# Patient Record
Sex: Female | Born: 1995 | Race: Black or African American | Hispanic: No | Marital: Single | Smoking: Current some day smoker
Health system: Southern US, Community
[De-identification: ages and names within clinical notes are randomized; demographics above are authoritative.]

## PROBLEM LIST (undated history)

## (undated) DIAGNOSIS — I456 Pre-excitation syndrome: Secondary | ICD-10-CM

---

## 2017-01-27 ENCOUNTER — Other Ambulatory Visit: Payer: Self-pay

## 2017-01-27 DIAGNOSIS — R197 Diarrhea, unspecified: Secondary | ICD-10-CM | POA: Insufficient documentation

## 2017-01-27 DIAGNOSIS — Z5321 Procedure and treatment not carried out due to patient leaving prior to being seen by health care provider: Secondary | ICD-10-CM | POA: Insufficient documentation

## 2017-01-27 DIAGNOSIS — R112 Nausea with vomiting, unspecified: Secondary | ICD-10-CM | POA: Insufficient documentation

## 2017-01-27 LAB — CBC
HEMATOCRIT: 38.3 % (ref 35.0–47.0)
HEMOGLOBIN: 12 g/dL (ref 12.0–16.0)
MCH: 21.3 pg — ABNORMAL LOW (ref 26.0–34.0)
MCHC: 31.5 g/dL — AB (ref 32.0–36.0)
MCV: 67.7 fL — ABNORMAL LOW (ref 80.0–100.0)
Platelets: 174 10*3/uL (ref 150–440)
RBC: 5.66 MIL/uL — AB (ref 3.80–5.20)
RDW: 14.8 % — ABNORMAL HIGH (ref 11.5–14.5)
WBC: 4 10*3/uL (ref 3.6–11.0)

## 2017-01-27 LAB — POCT PREGNANCY, URINE: PREG TEST UR: NEGATIVE

## 2017-01-27 NOTE — ED Triage Notes (Signed)
Pt arrives to ED via POV with c/o generalized abdominal pain that started 2 days ago. Pt reports (+) N/V/D. Pt denies known sick contacts. Pt reports (2) episodes of emesis and (3) episodes of diarrhea in the last 24 hrs. Pt is A&O, in NAD; RR even, regular, and unlabored; skin color/temp is WNL.

## 2017-01-28 ENCOUNTER — Emergency Department
Admission: EM | Admit: 2017-01-28 | Discharge: 2017-01-28 | Disposition: A | Discharge status: Home / Self Care | Payer: Self-pay | Attending: Emergency Medicine | Admitting: Emergency Medicine

## 2017-01-28 LAB — URINALYSIS, COMPLETE (UACMP) WITH MICROSCOPIC
BILIRUBIN URINE: NEGATIVE
Bacteria, UA: NONE SEEN
Glucose, UA: NEGATIVE mg/dL
Hgb urine dipstick: NEGATIVE
KETONES UR: NEGATIVE mg/dL
LEUKOCYTES UA: NEGATIVE
NITRITE: NEGATIVE
PH: 8 (ref 5.0–8.0)
Protein, ur: NEGATIVE mg/dL
SQUAMOUS EPITHELIAL / LPF: NONE SEEN
Specific Gravity, Urine: 1.016 (ref 1.005–1.030)

## 2017-01-28 LAB — COMPREHENSIVE METABOLIC PANEL
ALK PHOS: 65 U/L (ref 38–126)
ALT: 10 U/L — ABNORMAL LOW (ref 14–54)
ANION GAP: 10 (ref 5–15)
AST: 23 U/L (ref 15–41)
Albumin: 4.1 g/dL (ref 3.5–5.0)
BILIRUBIN TOTAL: 0.3 mg/dL (ref 0.3–1.2)
BUN: 9 mg/dL (ref 6–20)
CALCIUM: 9.2 mg/dL (ref 8.9–10.3)
CO2: 21 mmol/L — ABNORMAL LOW (ref 22–32)
Chloride: 108 mmol/L (ref 101–111)
Creatinine, Ser: 0.63 mg/dL (ref 0.44–1.00)
GFR calc non Af Amer: >60 mL/min (ref >60)
GLUCOSE: 102 mg/dL — AB (ref 65–99)
Potassium: 3.7 mmol/L (ref 3.5–5.1)
Sodium: 139 mmol/L (ref 135–145)
TOTAL PROTEIN: 7.6 g/dL (ref 6.5–8.1)

## 2017-01-28 LAB — LIPASE, BLOOD: Lipase: 27 U/L (ref 11–51)

## 2017-01-29 ENCOUNTER — Telehealth: Payer: Self-pay | Admitting: Emergency Medicine

## 2017-01-29 NOTE — Telephone Encounter (Signed)
Called patient due to lwot to inquire about condition and follow up plans.  No answer and no voicemail  

## 2017-11-24 ENCOUNTER — Emergency Department
Admission: EM | Admit: 2017-11-24 | Discharge: 2017-11-25 | Disposition: A | Discharge status: Home / Self Care | Payer: Medicaid Other | Attending: Emergency Medicine | Admitting: Emergency Medicine

## 2017-11-24 ENCOUNTER — Other Ambulatory Visit: Payer: Self-pay

## 2017-11-24 ENCOUNTER — Emergency Department: Payer: Medicaid Other

## 2017-11-24 DIAGNOSIS — N12 Tubulo-interstitial nephritis, not specified as acute or chronic: Secondary | ICD-10-CM

## 2017-11-24 DIAGNOSIS — R1011 Right upper quadrant pain: Secondary | ICD-10-CM | POA: Insufficient documentation

## 2017-11-24 LAB — URINALYSIS, COMPLETE (UACMP) WITH MICROSCOPIC
Bacteria, UA: NONE SEEN
Bilirubin Urine: NEGATIVE
Glucose, UA: NEGATIVE mg/dL
Ketones, ur: 20 mg/dL — AB
Nitrite: NEGATIVE
Protein, ur: 100 mg/dL — AB
Specific Gravity, Urine: 1.013 (ref 1.005–1.030)
WBC, UA: >50 WBC/hpf — ABNORMAL HIGH (ref 0–5)
pH: 9 — ABNORMAL HIGH (ref 5.0–8.0)

## 2017-11-24 LAB — POCT PREGNANCY, URINE: Preg Test, Ur: NEGATIVE

## 2017-11-24 MED ORDER — IOPAMIDOL (ISOVUE-300) INJECTION 61%
15.0000 mL | INTRAVENOUS | Status: AC
  Administered 2017-11-24 (×2): 15 mL via ORAL
  Filled 2017-11-24 (×2): qty 15

## 2017-11-24 MED ORDER — MORPHINE SULFATE (PF) 4 MG/ML IV SOLN
4.0000 mg | Freq: Once | INTRAVENOUS | Status: AC
  Administered 2017-11-24: 4 mg via INTRAVENOUS
  Filled 2017-11-24: qty 1

## 2017-11-24 MED ORDER — ONDANSETRON HCL 4 MG/2ML IJ SOLN
4.0000 mg | Freq: Once | INTRAMUSCULAR | Status: AC
  Administered 2017-11-24: 4 mg via INTRAVENOUS
  Filled 2017-11-24: qty 2

## 2017-11-24 NOTE — ED Provider Notes (Signed)
[facility] Emergency Department Provider Note  ____________________________________________  Time seen: Approximately 10:51 PM  I have reviewed the triage vital signs and the nursing notes.   HISTORY  Chief Complaint Rib Injury    HPI Evelyn Rogers is a 22 y.o. female presents to the emergency department with 8 out of 10 sharp abdominal pain primarily in the right upper quadrant that has occurred for the past 3 days.  She reports that when symptoms started, she could not discern whether her pain was coming from her right sided upper back or underneath her ribs.  Patient has had fever, shortness of breath and vomiting. Abdominal pain is worse after eating or drinking.  She has never had similar symptoms in the past.  No prior GI issues or surgeries.  She denies dysuria, hematuria or increased urinary frequency.  No prior history of pyelonephritis or nephrolithiasis.  There have been no changes in vaginal discharge.  Patient is sexually active and reports that she always uses protection. Denies possibility of pregnancy. Patient has not had excessive anti-inflammatories or alcohol.  No hematochezia or hemoptysis.  No current cough, rhinorrhea or congestion.   History reviewed. No pertinent past medical history.  There are no active problems to display for this patient.   History reviewed. No pertinent surgical history.  Prior to Admission medications   Not on File    Allergies Penicillins  No family history on file.  Social History Social History   Tobacco Use  . Smoking status: Not on file  Substance Use Topics  . Alcohol use: Not on file  . Drug use: Not on file     Review of Systems  Constitutional: Patient has fever.  Eyes: No visual changes. No discharge ENT: No upper respiratory complaints. Cardiovascular: no chest pain. Respiratory: no cough. No SOB. Gastrointestinal: Patient has abdominal pain and nausea. No diarrhea.  No  constipation. Genitourinary: Negative for dysuria. No hematuria Musculoskeletal: Negative for musculoskeletal pain. Skin: Negative for rash, abrasions, lacerations, ecchymosis. Neurological: Negative for headaches, focal weakness or numbness.   ____________________________________________   PHYSICAL EXAM:  VITAL SIGNS: ED Triage Vitals  Enc Vitals Group     BP 11/24/17 2154 118/70     Pulse Rate 11/24/17 2154 (!) 102     Resp 11/24/17 2154 (!) 45     Temp 11/24/17 2154 99.9 F (37.7 C)     Temp Source 11/24/17 2154 Oral     SpO2 11/24/17 2154 100 %     Weight 11/24/17 2155 112 lb (50.8 kg)     Height 11/24/17 2155 5' (1.524 m)     Head Circumference --      Peak Flow --      Pain Score 11/24/17 2220 8     Pain Loc --      Pain Edu? --      Excl. in GC? --      Constitutional: Alert and oriented.  Patient appears in pain. Eyes: Conjunctivae are normal. PERRL. EOMI. Head: Atraumatic. ENT:      Nose: No congestion/rhinnorhea.      Mouth/Throat: Mucous membranes are moist.  Neck: No stridor.  No cervical spine tenderness to palpation. Hematological/Lymphatic/Immunilogical: No cervical lymphadenopathy. Cardiovascular: Normal rate, regular rhythm. Normal S1 and S2.  Good peripheral circulation. Respiratory: Normal respiratory effort without tachypnea or retractions. Lungs CTAB. Good air entry to the bases with no decreased or absent breath sounds. Gastrointestinal: To inspection, no striae or prior surgical incisions.  Patient has  guarding and rigidity of the right upper quadrant.  Positive Murphy sign.  Patient has tenderness to palpation in right and left lower quadrants.  Bowel sounds are auscultated in all 4 quadrants.  CVA tenderness is elicited on the right. Musculoskeletal: Full range of motion to all extremities. No gross deformities appreciated. Neurologic:  Normal speech and language. No gross focal neurologic deficits are appreciated.  Skin:  Skin is warm, dry and  intact. No rash noted. Psychiatric: Mood and affect are normal. Speech and behavior are normal. Patient exhibits appropriate insight and judgement.   ____________________________________________   LABS (all labs ordered are listed, but only abnormal results are displayed)  Labs Reviewed  CBC WITH DIFFERENTIAL/PLATELET  COMPREHENSIVE METABOLIC PANEL  URINALYSIS, COMPLETE (UACMP) WITH MICROSCOPIC  LACTIC ACID, PLASMA  LACTIC ACID, PLASMA  LIPASE, BLOOD  HCG, QUANTITATIVE, PREGNANCY  POC URINE PREG, ED  POCT PREGNANCY, URINE   ____________________________________________  EKG   ____________________________________________  RADIOLOGY I personally viewed and evaluated these images as part of my medical decision making, as well as reviewing the written report by the radiologist.    Dg Chest 2 View  Result Date: 11/24/2017 CLINICAL DATA:  Right upper quadrant pain EXAM: CHEST - 2 VIEW COMPARISON:  None. FINDINGS: The heart size and mediastinal contours are within normal limits. Both lungs are clear. The visualized skeletal structures are unremarkable. IMPRESSION: No active cardiopulmonary disease. Electronically Signed   By: Jasmine Pang M.D.   On: 11/24/2017 23:14    ____________________________________________    PROCEDURES  Procedure(s) performed:    Procedures    Medications  iopamidol (ISOVUE-300) 61 % injection 15 mL (15 mLs Oral Contrast Given 11/24/17 2300)  morphine 4 MG/ML injection 4 mg (4 mg Intravenous Given 11/24/17 2327)  ondansetron (ZOFRAN) injection 4 mg (4 mg Intravenous Given 11/24/17 2327)     ____________________________________________   INITIAL IMPRESSION / ASSESSMENT AND PLAN / ED COURSE  Pertinent labs & imaging results that were available during my care of the patient were reviewed by me and considered in my medical decision making (see chart for details).  Review of the [place] CSRS was performed in accordance of the [facility] prior to  dispensing any controlled drugs.    Assessment and Plan:  Abdominal pain Differential diagnosis includes cholecystitis, nephrolithiasis, pyelonephritis, appendicitis, diverticulitis and pulmonary embolism. Patient presents to the emergency department with worsening right upper quadrant abdominal pain for the past three days.  On physical exam, patient also had guarding and rigidity in the right upper quadrant and tenderness in the right and left lower quadrants before morphine was administered.  Due to diffuse nature of pain, CT abdomen and pelvis was ordered.  Patient was transitioned to main side of the emergency department while awaiting labs.  Dr. Marisa Severin assumed patient care.   ____________________________________________  FINAL CLINICAL IMPRESSION(S) / ED DIAGNOSES  Final diagnoses:  Right upper quadrant abdominal pain      NEW MEDICATIONS STARTED DURING THIS VISIT:  ED Discharge Orders    None          This chart was dictated using voice recognition software/Dragon. Despite best efforts to proofread, errors can occur which can change the meaning. Any change was purely unintentional.    Orvil Feil, PA-C 11/24/17 2356    Dionne Bucy, MD 12/04/17 [phone]

## 2017-11-24 NOTE — ED Triage Notes (Signed)
Pt comes via POV from home with c/o right sided rib pain. Pt states this started about 3 days ago and it has gotten worse. Pt denies any recent injury. Pt states some nausea.

## 2017-11-24 NOTE — ED Notes (Signed)
Pt went to CT

## 2017-11-25 ENCOUNTER — Emergency Department: Payer: Medicaid Other

## 2017-11-25 ENCOUNTER — Encounter: Payer: Self-pay | Admitting: Radiology

## 2017-11-25 LAB — COMPREHENSIVE METABOLIC PANEL
ALT: 10 U/L (ref 0–44)
AST: 25 U/L (ref 15–41)
Albumin: 4.8 g/dL (ref 3.5–5.0)
Alkaline Phosphatase: 71 U/L (ref 38–126)
Anion gap: 12 (ref 5–15)
BUN: 9 mg/dL (ref 6–20)
CO2: 20 mmol/L — ABNORMAL LOW (ref 22–32)
Calcium: 9.3 mg/dL (ref 8.9–10.3)
Chloride: 105 mmol/L (ref 98–111)
Creatinine, Ser: 0.85 mg/dL (ref 0.44–1.00)
GFR calc Af Amer: >60 mL/min (ref >60)
GFR calc non Af Amer: >60 mL/min (ref >60)
Glucose, Bld: 82 mg/dL (ref 70–99)
Potassium: 3.3 mmol/L — ABNORMAL LOW (ref 3.5–5.1)
Sodium: 137 mmol/L (ref 135–145)
Total Bilirubin: 0.6 mg/dL (ref 0.3–1.2)
Total Protein: 8.9 g/dL — ABNORMAL HIGH (ref 6.5–8.1)

## 2017-11-25 LAB — CBC WITH DIFFERENTIAL/PLATELET
Basophils Absolute: 0.1 10*3/uL (ref 0–0.1)
Basophils Relative: 1 %
Eosinophils Absolute: 0 10*3/uL (ref 0–0.7)
Eosinophils Relative: 0 %
HCT: 35.6 % (ref 35.0–47.0)
Hemoglobin: 11.6 g/dL — ABNORMAL LOW (ref 12.0–16.0)
Lymphocytes Relative: 34 %
Lymphs Abs: 2.4 10*3/uL (ref 1.0–3.6)
MCH: 21.9 pg — ABNORMAL LOW (ref 26.0–34.0)
MCHC: 32.7 g/dL (ref 32.0–36.0)
MCV: 66.8 fL — ABNORMAL LOW (ref 80.0–100.0)
Monocytes Absolute: 0.8 10*3/uL (ref 0.2–0.9)
Monocytes Relative: 11 %
Neutro Abs: 3.8 10*3/uL (ref 1.4–6.5)
Neutrophils Relative %: 54 %
Platelets: 169 10*3/uL (ref 150–400)
RBC: 5.33 MIL/uL — ABNORMAL HIGH (ref 3.80–5.20)
RDW: 15.1 % — ABNORMAL HIGH (ref 11.5–14.5)
WBC: 7 10*3/uL (ref 3.6–11.0)

## 2017-11-25 LAB — LIPASE, BLOOD: Lipase: 27 U/L (ref 11–51)

## 2017-11-25 LAB — TROPONIN I

## 2017-11-25 LAB — LACTIC ACID, PLASMA
LACTIC ACID, VENOUS: 0.8 mmol/L (ref 0.5–1.9)
Lactic Acid, Venous: 2.3 mmol/L (ref 0.5–1.9)

## 2017-11-25 LAB — HCG, QUANTITATIVE, PREGNANCY: hCG, Beta Chain, Quant, S: <1 m[IU]/mL (ref <5)

## 2017-11-25 MED ORDER — IBUPROFEN 600 MG PO TABS: 600.0000 mg | ORAL_TABLET | Freq: Four times a day (QID) | ORAL | 0 refills | Status: AC | PRN

## 2017-11-25 MED ORDER — SODIUM CHLORIDE 0.9 % IV BOLUS
1000.0000 mL | Freq: Once | INTRAVENOUS | Status: AC
  Administered 2017-11-25: 1000 mL via INTRAVENOUS

## 2017-11-25 MED ORDER — SODIUM CHLORIDE 0.9 % IV SOLN
1.0000 g | Freq: Once | INTRAVENOUS | Status: AC
  Administered 2017-11-25: 1 g via INTRAVENOUS
  Filled 2017-11-25: qty 10

## 2017-11-25 MED ORDER — ONDANSETRON 8 MG PO TBDP: 8.0000 mg | ORAL_TABLET | Freq: Three times a day (TID) | ORAL | 0 refills | Status: AC | PRN

## 2017-11-25 MED ORDER — IOHEXOL 300 MG/ML  SOLN
75.0000 mL | Freq: Once | INTRAMUSCULAR | Status: AC | PRN
  Administered 2017-11-25: 75 mL via INTRAVENOUS

## 2017-11-25 MED ORDER — CEPHALEXIN 500 MG PO CAPS: 500.0000 mg | ORAL_CAPSULE | Freq: Two times a day (BID) | ORAL | 0 refills | Status: AC

## 2017-11-25 NOTE — ED Notes (Signed)
Pt returns to room from CT 

## 2017-11-25 NOTE — Discharge Instructions (Signed)
Take the antibiotic as prescribed and finish the full course.  You should take the ibuprofen as needed for pain and the Zofran (ondansetron) as needed for nausea.  Return to the ER for new, worsening, or persistent severe pain, high fevers, vomiting or inability to take the medication, weakness, or any other new or worsening symptoms that concern you.

## 2017-11-25 NOTE — ED Provider Notes (Signed)
-----------------------------------------   12:03 AM on 11/25/2017 -----------------------------------------  I am sharing this visit with PA Joseph Art, and have taken over for the PA at this time.  I examined the patient independently.  She reports right-sided abdominal pain radiating to the back into the chest since this morning and over the last few days.  On exam, she has a borderline fever and tachycardia, but otherwise normal vital signs.  She is relatively well-appearing.  She is tender throughout the right side of the abdomen, and especially in the right lower quadrant.  Labs are pending at this time.  ED ECG REPORT I, Dionne Bucy, the attending physician, personally viewed and interpreted this ECG.  Date: 11/25/2017 EKG Time: 2338 Rate: 85 Rhythm: normal sinus rhythm QRS Axis: normal Intervals: normal ST/T Wave abnormalities: T wave inversions in V2 to V4 Narrative Interpretation: Nonspecific T wave abnormalities anterolateral   EKG shows nonspecific T wave abnormality with T wave inversions in V2 and V3.  The only prior EKGs available are in care everywhere from other hospital sites and I am not able to view images however the EKGs are described as having a nonspecific T wave abnormalities I believe this is likely chronic, and I do not suspect a cardiac etiology in this young otherwise healthy patient.  Overall the description of the patient's symptoms is most consistent with gallbladder disease, however her exam is also concerning for appendicitis or colitis.  We will therefore proceed with a CT of the abdomen and lab work-up and reassess.  ----------------------------------------- 4:23 AM on 11/25/2017 -----------------------------------------  The CT revealed cystitis but no evidence of appendicitis or gallbladder disease.  The patient's lab work-up was reassuring except for slightly elevated lactate and her UA is consistent with infection.  Given the location of the  patient's pain I think that her presentation is more consistent with early pyelonephritis.  After fluids her lactate normalized, and her vital signs have remained normal for several hours.  There is no evidence of sepsis.  The patient has not required any additional pain medication since the initial dose.  She feels well and would like to go home.  I think she is safe for discharge at this time.  I discussed with her that pyelonephritis sometimes requires admission for IV antibiotics and gave thorough return precautions.  The patient expressed understanding.   Dionne Bucy, MD 11/25/17 0425

## 2017-11-25 NOTE — ED Notes (Signed)
Pt to CT via stretcher accomp by CT tech 

## 2017-12-28 ENCOUNTER — Other Ambulatory Visit: Payer: Self-pay

## 2017-12-28 ENCOUNTER — Emergency Department
Admission: EM | Admit: 2017-12-28 | Discharge: 2017-12-28 | Disposition: A | Discharge status: Home / Self Care | Payer: Medicaid Other | Attending: Emergency Medicine | Admitting: Emergency Medicine

## 2017-12-28 DIAGNOSIS — F17200 Nicotine dependence, unspecified, uncomplicated: Secondary | ICD-10-CM | POA: Insufficient documentation

## 2017-12-28 DIAGNOSIS — B9789 Other viral agents as the cause of diseases classified elsewhere: Secondary | ICD-10-CM

## 2017-12-28 DIAGNOSIS — R05 Cough: Secondary | ICD-10-CM | POA: Insufficient documentation

## 2017-12-28 DIAGNOSIS — J069 Acute upper respiratory infection, unspecified: Secondary | ICD-10-CM | POA: Insufficient documentation

## 2017-12-28 HISTORY — DX: Pre-excitation syndrome: I45.6

## 2017-12-28 MED ORDER — PREDNISONE 20 MG PO TABS
60.0000 mg | ORAL_TABLET | Freq: Once | ORAL | Status: AC
  Administered 2017-12-28: 60 mg via ORAL
  Filled 2017-12-28: qty 3

## 2017-12-28 MED ORDER — CETIRIZINE-PSEUDOEPHEDRINE ER 5-120 MG PO TB12: 1.0000 | ORAL_TABLET | Freq: Two times a day (BID) | ORAL | 0 refills | Status: AC

## 2017-12-28 MED ORDER — DEXAMETHASONE SODIUM PHOSPHATE 10 MG/ML IJ SOLN: 10.0000 mg | Freq: Once | INTRAMUSCULAR | Status: DC

## 2017-12-28 MED ORDER — BENZONATATE 100 MG PO CAPS: ORAL_CAPSULE | ORAL | 0 refills | Status: DC

## 2017-12-28 MED ORDER — ONDANSETRON 4 MG PO TBDP: 4.0000 mg | ORAL_TABLET | Freq: Three times a day (TID) | ORAL | 0 refills | Status: AC | PRN

## 2017-12-28 MED ORDER — ONDANSETRON 4 MG PO TBDP
4.0000 mg | ORAL_TABLET | Freq: Once | ORAL | Status: AC
  Administered 2017-12-28: 4 mg via ORAL
  Filled 2017-12-28: qty 1

## 2017-12-28 NOTE — ED Provider Notes (Signed)
[facility] Emergency Department Provider Note ____________________________________________  Time seen: 2130  I have reviewed the triage vital signs and the nursing notes.  HISTORY  Chief Complaint  Influenza  HPI Evelyn Rogers is a 22 y.o. female who presents herself to the ED for evaluation of a 4-day complaint of chills, sweats, vomiting, and flulike symptoms.  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  She denies any significant productive cough, chest pain, congestion, or shortness of breath.  She is unclear whether she has had frank fevers, but has noted some sweats.  She denies any recent travel or other exposures at this time.  She has been taking TheraFlu and ibuprofen with limited benefit.  Past Medical History:  Diagnosis Date  . Wolff-Parkinson-White syndrome     There are no active problems to display for this patient.   History reviewed. No pertinent surgical history.  Prior to Admission medications   Medication Sig Start Date End Date Taking? Authorizing Provider  benzonatate (TESSALON PERLES) 100 MG capsule Take 1-2 tabs TID prn cough 12/28/17   Dalaina Tates, Charlesetta Ivory, PA-C  cetirizine-pseudoephedrine (ZYRTEC-D) 5-120 MG tablet Take 1 tablet by mouth 2 (two) times daily for 10 days. 12/28/17 01/07/18  Gulianna Hornsby, Charlesetta Ivory, PA-C  ibuprofen (ADVIL,MOTRIN) 600 MG tablet Take 1 tablet (600 mg total) by mouth every 6 (six) hours as needed (pain). 11/25/17   Dionne Bucy, MD  ondansetron (ZOFRAN ODT) 4 MG disintegrating tablet Take 1 tablet (4 mg total) by mouth every 8 (eight) hours as needed for up to 3 days. 12/28/17 12/31/17  Tommie Dejoseph, Charlesetta Ivory, PA-C    Allergies Chocolate and Penicillins  History reviewed. No pertinent family history.  Social History Social History   Tobacco Use  . Smoking status: Current Every Day Smoker  Substance Use Topics  . Alcohol use: Yes  . Drug use: Not on file     Review of Systems  Constitutional: Negative for fever.  Reports sweats and hot/cold flashes.. Eyes: Negative for visual changes. ENT: Negative for sore throat. Cardiovascular: Negative for chest pain. Respiratory: Negative for shortness of breath. Gastrointestinal: Negative for abdominal pain, and diarrhea.  Reports intermittent episodes of nausea and vomiting. Genitourinary: Negative for dysuria. Musculoskeletal: Negative for back pain.  Reports some mild body aches. Skin: Negative for rash. Neurological: Negative for headaches, focal weakness or numbness. ____________________________________________  PHYSICAL EXAM:  VITAL SIGNS: ED Triage Vitals  Enc Vitals Group     BP 12/28/17 1858 128/78     Pulse Rate 12/28/17 1858 89     Resp 12/28/17 1858 18     Temp 12/28/17 1858 98.6 F (37 C)     Temp Source 12/28/17 1858 Oral     SpO2 12/28/17 1858 100 %     Weight 12/28/17 1859 112 lb (50.8 kg)     Height 12/28/17 1859 5' (1.524 m)     Head Circumference --      Peak Flow --      Pain Score 12/28/17 1859 7     Pain Loc --      Pain Edu? --      Excl. in GC? --     Constitutional: Alert and oriented. Well appearing and in no distress. Head: Normocephalic and atraumatic. Eyes: Conjunctivae are normal. PERRL. Normal extraocular movements Ears: Canals clear. TMs intact bilaterally. Nose: No congestion/rhinorrhea/epistaxis. Mouth/Throat: Mucous membranes are moist.  Uvula is midline and tonsils are flat.  No oropharyngeal  lesions are appreciated. Neck: Supple. No thyromegaly. Hematological/Lymphatic/Immunological: No cervical lymphadenopathy. Cardiovascular: Normal rate, regular rhythm. Normal distal pulses. Respiratory: Normal respiratory effort. No wheezes/rales/rhonchi. Gastrointestinal: Soft and nontender. No distention.  Normal bowel sounds noted. ____________________________________________  PROCEDURES  Procedures Zofran 4 mg ODT Prednisone 60 mg  PO ____________________________________________  INITIAL IMPRESSION / ASSESSMENT AND PLAN / ED COURSE  Patient with ED evaluation of flulike symptoms.  Patient symptoms likely represent a viral etiology including a possible viral gastroenteritis.  She has not had any nausea or vomiting in the last 18 to 24 hours.  Patient is also been afebrile during her course and on presentation in the ED.  Symptoms likely represent a viral URI.  She will be treated with prescriptions for Tessalon Perles, Zyrtec D, and Zofran for nausea.  She is encouraged to take an over-the-counter Mucinex as needed for thinning the mucus is necessary.  A work note is provided for today and tomorrow as requested.  She should follow-up with your clinic for ongoing symptom management. ____________________________________________  FINAL CLINICAL IMPRESSION(S) / ED DIAGNOSES  Final diagnoses:  Viral URI with cough      Eriyah Fernando, Charlesetta Ivory, PA-C 12/28/17 2312    Rockne Menghini, MD 12/31/17 1422

## 2017-12-28 NOTE — Discharge Instructions (Addendum)
You are being treated for a viral URI. Take the prescription meds as directed. Follow-up with [facility] for ongoing symptoms.

## 2017-12-28 NOTE — ED Notes (Signed)
Cough  Body  Aches  Sneezing coughing  Vomiting /  Nose bleeds  symptoms x  3  Days no  Fever  Speaking in complete  sentences and is in no acute  Distress

## 2017-12-28 NOTE — ED Triage Notes (Addendum)
Pt c/o of flu like symptoms. Hot and cold flashes, nose bleeds, sweats, vomiting, unable to sleep. Symptoms since Thursday. 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   Denies cough, congestion, unsure of fever.   A&O, ambulatory. No distress noted, mask in place.

## 2017-12-30 ENCOUNTER — Other Ambulatory Visit: Payer: Self-pay

## 2017-12-30 ENCOUNTER — Encounter: Payer: Self-pay | Admitting: Emergency Medicine

## 2017-12-30 DIAGNOSIS — J101 Influenza due to other identified influenza virus with other respiratory manifestations: Secondary | ICD-10-CM | POA: Insufficient documentation

## 2017-12-30 DIAGNOSIS — R079 Chest pain, unspecified: Secondary | ICD-10-CM | POA: Insufficient documentation

## 2017-12-30 DIAGNOSIS — F17210 Nicotine dependence, cigarettes, uncomplicated: Secondary | ICD-10-CM | POA: Insufficient documentation

## 2017-12-30 LAB — CBC
HEMATOCRIT: 35.6 % — AB (ref 36.0–46.0)
HEMOGLOBIN: 11.4 g/dL — AB (ref 12.0–15.0)
MCH: 21.3 pg — AB (ref 26.0–34.0)
MCHC: 32 g/dL (ref 30.0–36.0)
MCV: 66.5 fL — ABNORMAL LOW (ref 80.0–100.0)
NRBC: 0 % (ref 0.0–0.2)
Platelets: 89 10*3/uL — ABNORMAL LOW (ref 150–400)
RBC: 5.35 MIL/uL — AB (ref 3.87–5.11)
RDW: 15.9 % — ABNORMAL HIGH (ref 11.5–15.5)
WBC: 3 10*3/uL — ABNORMAL LOW (ref 4.0–10.5)

## 2017-12-30 LAB — COMPREHENSIVE METABOLIC PANEL
ALBUMIN: 4.2 g/dL (ref 3.5–5.0)
ALK PHOS: 52 U/L (ref 38–126)
ALT: 21 U/L (ref 0–44)
AST: 35 U/L (ref 15–41)
Anion gap: 10 (ref 5–15)
BUN: 12 mg/dL (ref 6–20)
CHLORIDE: 107 mmol/L (ref 98–111)
CO2: 23 mmol/L (ref 22–32)
Calcium: 8.7 mg/dL — ABNORMAL LOW (ref 8.9–10.3)
Creatinine, Ser: 0.59 mg/dL (ref 0.44–1.00)
GFR calc non Af Amer: >60 mL/min (ref >60)
Glucose, Bld: 127 mg/dL — ABNORMAL HIGH (ref 70–99)
POTASSIUM: 3.4 mmol/L — AB (ref 3.5–5.1)
Sodium: 140 mmol/L (ref 135–145)
Total Bilirubin: 0.7 mg/dL (ref 0.3–1.2)
Total Protein: 8 g/dL (ref 6.5–8.1)

## 2017-12-30 LAB — URINALYSIS, COMPLETE (UACMP) WITH MICROSCOPIC
Bacteria, UA: NONE SEEN
Bilirubin Urine: NEGATIVE
Glucose, UA: NEGATIVE mg/dL
Hgb urine dipstick: NEGATIVE
Ketones, ur: 20 mg/dL — AB
Nitrite: NEGATIVE
Protein, ur: 30 mg/dL — AB
Specific Gravity, Urine: 1.024 (ref 1.005–1.030)
pH: 6 (ref 5.0–8.0)

## 2017-12-30 LAB — POCT PREGNANCY, URINE: Preg Test, Ur: NEGATIVE

## 2017-12-30 LAB — LIPASE, BLOOD: Lipase: 49 U/L (ref 11–51)

## 2017-12-30 NOTE — ED Triage Notes (Signed)
Patient ambulatory to triage with steady gait, without difficulty or distress noted; pt reports here Sunday and dx with virus; cont to have N/V/D, back pain, and diff sleeping

## 2017-12-31 ENCOUNTER — Emergency Department: Payer: Self-pay

## 2017-12-31 ENCOUNTER — Emergency Department
Admission: EM | Admit: 2017-12-31 | Discharge: 2017-12-31 | Disposition: A | Discharge status: Home / Self Care | Payer: Self-pay | Attending: Emergency Medicine | Admitting: Emergency Medicine

## 2017-12-31 ENCOUNTER — Other Ambulatory Visit: Payer: Self-pay

## 2017-12-31 DIAGNOSIS — J101 Influenza due to other identified influenza virus with other respiratory manifestations: Secondary | ICD-10-CM

## 2017-12-31 LAB — INFLUENZA PANEL BY PCR (TYPE A & B)
INFLAPCR: NEGATIVE
Influenza B By PCR: POSITIVE — AB

## 2017-12-31 MED ORDER — IBUPROFEN 600 MG PO TABS
600.0000 mg | ORAL_TABLET | Freq: Once | ORAL | Status: AC
  Administered 2017-12-31: 600 mg via ORAL
  Filled 2017-12-31: qty 1

## 2017-12-31 MED ORDER — OXYMETAZOLINE HCL 0.05 % NA SOLN
1.0000 | Freq: Once | NASAL | Status: AC
  Administered 2017-12-31: 1 via NASAL
  Filled 2017-12-31: qty 15

## 2017-12-31 MED ORDER — LIDOCAINE HCL (PF) 1 % IJ SOLN
5.0000 mL | Freq: Once | INTRAMUSCULAR | Status: AC
  Administered 2017-12-31: 5 mL via INTRADERMAL
  Filled 2017-12-31: qty 5

## 2017-12-31 MED ORDER — ACETAMINOPHEN 500 MG PO TABS
1000.0000 mg | ORAL_TABLET | Freq: Once | ORAL | Status: AC
  Administered 2017-12-31: 1000 mg via ORAL
  Filled 2017-12-31: qty 2

## 2017-12-31 MED ORDER — ALBUTEROL SULFATE (2.5 MG/3ML) 0.083% IN NEBU
5.0000 mg | INHALATION_SOLUTION | Freq: Once | RESPIRATORY_TRACT | Status: AC
  Administered 2017-12-31: 5 mg via RESPIRATORY_TRACT
  Filled 2017-12-31: qty 6

## 2017-12-31 MED ORDER — NAPROXEN 500 MG PO TABS: 500.0000 mg | ORAL_TABLET | Freq: Two times a day (BID) | ORAL | 0 refills | Status: AC

## 2017-12-31 MED ORDER — ALBUTEROL SULFATE HFA 108 (90 BASE) MCG/ACT IN AERS: 2.0000 | INHALATION_SPRAY | Freq: Four times a day (QID) | RESPIRATORY_TRACT | 0 refills | Status: AC | PRN

## 2017-12-31 MED ORDER — BENZONATATE 200 MG PO CAPS: 200.0000 mg | ORAL_CAPSULE | Freq: Four times a day (QID) | ORAL | 0 refills | Status: AC | PRN

## 2017-12-31 MED ORDER — PSEUDOEPHEDRINE HCL 30 MG PO TABS
60.0000 mg | ORAL_TABLET | Freq: Once | ORAL | Status: AC
  Administered 2017-12-31: 60 mg via ORAL
  Filled 2017-12-31: qty 2

## 2017-12-31 NOTE — Discharge Instructions (Signed)
Today your positive for influenza B.  It is normal with influenza to be sick up to a full 10 to 14 days.  Please take naproxen twice a day as prescribed and use your albuterol and Tessalon Perles as needed for shortness of breath and cough.  Please take the next 2 days off of work and make sure you remain well-hydrated.  Return to the emergency department for any concerns.  It was a pleasure to take care of you today, and thank you for coming to our emergency department.  If you have any questions or concerns before leaving please ask the nurse to grab me and I'm more than happy to go through your aftercare instructions again.  If you were prescribed any opioid pain medication today such as Norco, Vicodin, Percocet, morphine, hydrocodone, or oxycodone please make sure you do not drive when you are taking this medication as it can alter your ability to drive safely.  If you have any concerns once you are home that you are not improving or are in fact getting worse before you can make it to your follow-up appointment, please do not hesitate to call 911 and come back for further evaluation.  Merrily Brittle, MD  Results for orders placed or performed during the hospital encounter of 12/31/17  Lipase, blood  Result Value Ref Range   Lipase 49 11 - 51 U/L  Comprehensive metabolic panel  Result Value Ref Range   Sodium 140 135 - 145 mmol/L   Potassium 3.4 (L) 3.5 - 5.1 mmol/L   Chloride 107 98 - 111 mmol/L   CO2 23 22 - 32 mmol/L   Glucose, Bld 127 (H) 70 - 99 mg/dL   BUN 12 6 - 20 mg/dL   Creatinine, Ser 2.13 0.44 - 1.00 mg/dL   Calcium 8.7 (L) 8.9 - 10.3 mg/dL   Total Protein 8.0 6.5 - 8.1 g/dL   Albumin 4.2 3.5 - 5.0 g/dL   AST 35 15 - 41 U/L   ALT 21 0 - 44 U/L   Alkaline Phosphatase 52 38 - 126 U/L   Total Bilirubin 0.7 0.3 - 1.2 mg/dL   GFR calc non Af Amer >60 >60 mL/min   GFR calc Af Amer >60 >60 mL/min   Anion gap 10 5 - 15  CBC  Result Value Ref Range   WBC 3.0 (L) 4.0 - 10.5  K/uL   RBC 5.35 (H) 3.87 - 5.11 MIL/uL   Hemoglobin 11.4 (L) 12.0 - 15.0 g/dL   HCT 08.6 (L) 57.8 - 46.9 %   MCV 66.5 (L) 80.0 - 100.0 fL   MCH 21.3 (L) 26.0 - 34.0 pg   MCHC 32.0 30.0 - 36.0 g/dL   RDW 62.9 (H) 52.8 - 41.3 %   Platelets 89 (L) 150 - 400 K/uL   nRBC 0.0 0.0 - 0.2 %  Urinalysis, Complete w Microscopic  Result Value Ref Range   Color, Urine YELLOW (A) YELLOW   APPearance CLEAR (A) CLEAR   Specific Gravity, Urine 1.024 1.005 - 1.030   pH 6.0 5.0 - 8.0   Glucose, UA NEGATIVE NEGATIVE mg/dL   Hgb urine dipstick NEGATIVE NEGATIVE   Bilirubin Urine NEGATIVE NEGATIVE   Ketones, ur 20 (A) NEGATIVE mg/dL   Protein, ur 30 (A) NEGATIVE mg/dL   Nitrite NEGATIVE NEGATIVE   Leukocytes, UA TRACE (A) NEGATIVE   RBC / HPF 0-5 0 - 5 RBC/hpf   WBC, UA 0-5 0 - 5 WBC/hpf   Bacteria, UA  NONE SEEN NONE SEEN   Squamous Epithelial / LPF 6-10 0 - 5   Mucus PRESENT   Influenza panel by PCR (type A & B)  Result Value Ref Range   Influenza A By PCR NEGATIVE NEGATIVE   Influenza B By PCR POSITIVE (A) NEGATIVE  Pregnancy, urine POC  Result Value Ref Range   Preg Test, Ur NEGATIVE NEGATIVE   Dg Chest 2 View  Result Date: 12/31/2017 CLINICAL DATA:  Shortness of breath EXAM: CHEST - 2 VIEW COMPARISON:  12/31/2017 FINDINGS: The heart size and mediastinal contours are within normal limits. Both lungs are clear. The visualized skeletal structures are unremarkable. IMPRESSION: Clear lungs. Electronically Signed   By: Deatra Robinson M.D.   On: 12/31/2017 02:24

## 2017-12-31 NOTE — ED Notes (Signed)
Pt signed hardcopy of discharge paperwork 

## 2017-12-31 NOTE — ED Provider Notes (Signed)
[facility] Emergency Department Provider Note  ____________________________________________   First MD Initiated Contact with Patient 12/31/17 0106     (approximate)  I have reviewed the triage vital signs and the nursing notes.   HISTORY  Chief Complaint Emesis and Diarrhea   HPI Evelyn Rogers is a 22 y.o. female self presents to the emergency department with roughly 6 days of nasal congestion, dry cough, sharp upper chest pain, nausea vomiting diarrhea, back pain, and difficulty sleeping.  She has not noted any particular fevers.  She was seen in our emergency department 2 days ago and was diagnosed with "virus" however her symptoms persist.  She came primarily because of the myalgias as well as the cough which is worse at night.  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  She did not get her flu shot this year.  Symptoms came on gradually have been slowly progressive are now mostly constant.  They are worse at night.  They are improved somewhat with ibuprofen.    Past Medical History:  Diagnosis Date  . Wolff-Parkinson-White syndrome     There are no active problems to display for this patient.   History reviewed. No pertinent surgical history.  Prior to Admission medications   Medication Sig Start Date End Date Taking? Authorizing Provider  albuterol (PROVENTIL HFA;VENTOLIN HFA) 108 (90 Base) MCG/ACT inhaler Inhale 2 puffs into the lungs every 6 (six) hours as needed for wheezing or shortness of breath. 12/31/17   Merrily Brittle, MD  benzonatate (TESSALON) 200 MG capsule Take 1 capsule (200 mg total) by mouth every 6 (six) hours as needed for cough. 12/31/17 12/31/18  Merrily Brittle, MD  cetirizine-pseudoephedrine (ZYRTEC-D) 5-120 MG tablet Take 1 tablet by mouth 2 (two) times daily for 10 days. 12/28/17 01/07/18  Menshew, Charlesetta Ivory, PA-C  ibuprofen (ADVIL,MOTRIN) 600 MG tablet Take 1  tablet (600 mg total) by mouth every 6 (six) hours as needed (pain). 11/25/17   Dionne Bucy, MD  naproxen (NAPROSYN) 500 MG tablet Take 1 tablet (500 mg total) by mouth 2 (two) times daily with a meal. 12/31/17 12/31/18  Merrily Brittle, MD  ondansetron (ZOFRAN ODT) 4 MG disintegrating tablet Take 1 tablet (4 mg total) by mouth every 8 (eight) hours as needed for up to 3 days. 12/28/17 12/31/17  Menshew, Charlesetta Ivory, PA-C    Allergies Chocolate and Penicillins  No family history on file.  Social History Social History   Tobacco Use  . Smoking status: Current Some Day Smoker  . Smokeless tobacco: Never Used  Substance Use Topics  . Alcohol use: Yes  . Drug use: Not on file    Review of Systems Constitutional: No fever/chills Eyes: No visual changes. ENT: Positive for sore throat. Cardiovascular: Positive for chest pain. Respiratory: Positive for shortness of breath. Gastrointestinal: No abdominal pain.  Positive for nausea, positive for vomiting.  Positive for diarrhea.  No constipation. Genitourinary: Negative for dysuria. Musculoskeletal: Positive for back pain. Skin: Negative for rash. Neurological: Negative for headaches, focal weakness or numbness.   ____________________________________________   PHYSICAL EXAM:  VITAL SIGNS: ED Triage Vitals  Enc Vitals Group     BP 12/30/17 2103 114/77     Pulse Rate 12/30/17 2103 93     Resp 12/30/17 2103 18     Temp 12/30/17 2103 98.6 F (37 C)     Temp Source 12/30/17 2103 Oral  SpO2 --      Weight 12/30/17 2104 109 lb (49.4 kg)     Height 12/30/17 2104 5' (1.524 m)     Head Circumference --      Peak Flow --      Pain Score 12/30/17 2108 7     Pain Loc --      Pain Edu? --      Excl. in GC? --     Constitutional: Alert and oriented x4 appears obviously somewhat uncomfortable in speaking with nasal voice and dry cough Eyes: PERRL EOMI. Head: Atraumatic. Nose: Positive for significant  congestion Mouth/Throat: No trismus uvula midline no pharyngeal erythema or exudate Neck: No stridor.   Cardiovascular: Normal rate, regular rhythm. Grossly normal heart sounds.  Good peripheral circulation. Respiratory: Slightly increased respiratory effort with diffuse expiratory wheeze in all fields.  Moving good air Gastrointestinal: Soft nontender Musculoskeletal: No lower extremity edema   Neurologic:  Normal speech and language. No gross focal neurologic deficits are appreciated. Skin:  Skin is warm, dry and intact. No rash noted. Psychiatric: Mood and affect are normal. Speech and behavior are normal.    ____________________________________________   DIFFERENTIAL includes but not limited to  Bronchitis, upper respiratory tract infection, sinusitis, pneumonia, influenza ____________________________________________   LABS (all labs ordered are listed, but only abnormal results are displayed)  Labs Reviewed  COMPREHENSIVE METABOLIC PANEL - Abnormal; Notable for the following components:      Result Value   Potassium 3.4 (*)    Glucose, Bld 127 (*)    Calcium 8.7 (*)    All other components within normal limits  CBC - Abnormal; Notable for the following components:   WBC 3.0 (*)    RBC 5.35 (*)    Hemoglobin 11.4 (*)    HCT 35.6 (*)    MCV 66.5 (*)    MCH 21.3 (*)    RDW 15.9 (*)    Platelets 89 (*)    All other components within normal limits  URINALYSIS, COMPLETE (UACMP) WITH MICROSCOPIC - Abnormal; Notable for the following components:   Color, Urine YELLOW (*)    APPearance CLEAR (*)    Ketones, ur 20 (*)    Protein, ur 30 (*)    Leukocytes, UA TRACE (*)    All other components within normal limits  INFLUENZA PANEL BY PCR (TYPE A & B) - Abnormal; Notable for the following components:   Influenza B By PCR POSITIVE (*)    All other components within normal limits  LIPASE, BLOOD  POCT PREGNANCY, URINE    Lab work reviewed by me shows the patient is influenza  B positive.  She does have ketones in her urine consistent with some degree of starvation __________________________________________  EKG   ____________________________________________  RADIOLOGY  Chest x-ray reviewed by me with no ____________________________________________   PROCEDURES  Procedure(s) performed: no  Procedures  Critical Care performed: no  ____________________________________________   INITIAL IMPRESSION / ASSESSMENT AND PLAN / ED COURSE  Pertinent labs & imaging results that were available during my care of the patient were reviewed by me and considered in my medical decision making (see chart for details).   As part of my medical decision making, I reviewed the following data within the electronic MEDICAL RECORD NUMBER History obtained from family if available, nursing notes, old chart and ekg, as well as notes from prior ED visits.  The patient comes to the emergency department with significant congestion dry cough and myalgias for the past  6 days or so.  She did not get a flu shot.  Lab work obtained prior to my evaluation is largely unremarkable aside from some ketosis.  Given her persistent symptoms and wheeze I think is reasonable to get a chest x-ray looking for pneumonia.  She does not describe ever getting better and then acutely worse.  Also check her for influenza.  We will give a breathing treatment with albuterol as well as lidocaine and the breathing treatment for her cough Tylenol ibuprofen Sudafed and Afrin for myalgias and congestion.  The patient did come back influenza B positive.  She is outside of any sort of window for Tamiflu.  Her wheezing is consistent with bronchitis and her chest x-ray has no consolidations.  I explained the patient predicted clinical course and that is not unreasonable to expect to feel sick for up to 10 days with influenza.  I will prescribe her naproxen as has been shown to inhibit viral replication and influenza along with  Jerilynn Som and give her the next several days off of work.  Return precautions have been given.      ____________________________________________   FINAL CLINICAL IMPRESSION(S) / ED DIAGNOSES  Final diagnoses:  Influenza B      NEW MEDICATIONS STARTED DURING THIS VISIT:  Discharge Medication List as of 12/31/2017  2:32 AM    START taking these medications   Details  albuterol (PROVENTIL HFA;VENTOLIN HFA) 108 (90 Base) MCG/ACT inhaler Inhale 2 puffs into the lungs every 6 (six) hours as needed for wheezing or shortness of breath., Starting Wed 12/31/2017, Print    naproxen (NAPROSYN) 500 MG tablet Take 1 tablet (500 mg total) by mouth 2 (two) times daily with a meal., Starting Wed 12/31/2017, Until Thu 12/31/2018, Print         Note:  This document was prepared using Dragon voice recognition software and may include unintentional dictation errors.     Merrily Brittle, MD 12/31/17 [phone]

## 2017-12-31 NOTE — ED Notes (Signed)
Pt states having a HA, middle back pain, and congestion along with nausea and diarrhea. Pt reports her symptoms started on last Thursday. Family is with the pt. Pt is alert and oriented x 4.

## 2018-01-18 ENCOUNTER — Emergency Department
Admission: EM | Admit: 2018-01-18 | Discharge: 2018-01-18 | Disposition: A | Discharge status: Home / Self Care | Payer: Medicaid Other | Attending: Emergency Medicine | Admitting: Emergency Medicine

## 2018-01-18 ENCOUNTER — Other Ambulatory Visit: Payer: Self-pay

## 2018-01-18 DIAGNOSIS — M549 Dorsalgia, unspecified: Secondary | ICD-10-CM | POA: Insufficient documentation

## 2018-01-18 DIAGNOSIS — Z5321 Procedure and treatment not carried out due to patient leaving prior to being seen by health care provider: Secondary | ICD-10-CM | POA: Insufficient documentation

## 2018-01-18 NOTE — ED Triage Notes (Signed)
Patient reports having pain from her neck down her spine and difficulty breathing that woke her from sleep.  Patient states unsure if it was anxiety or not.  Patient reports having multiple stressors - jobs and roommate.

## 2018-01-18 NOTE — ED Notes (Signed)
Patient to waiting room via wheelchair by EMS for anxiety and pain from neck down.  EMS reports all vitals within normal limits.  Per EMS patient was ambulatory without difficulty at scene.

## 2018-01-18 NOTE — ED Notes (Signed)
Pt to the door tearful asking for AMA papers. Pt states she does not want to wait anymore and she has things to do today and she is in pain. Told pt to stay if she was in pain so she could receive help. Pt states she just wants to go home. Pt refused to stay and allow treatment to continue. Pt states she has a job interview this morning. Pt signed AMA form.

## 2018-01-18 NOTE — ED Notes (Signed)
Pt noted to be walking in waiting room; asked pt if she had already been seen; she says she didn't but she has to leave;

## 2018-01-18 NOTE — ED Notes (Signed)
Pt awake and alert; waiting patiently for treatment room 

## 2019-09-09 IMAGING — CT CT ABD-PELV W/ CM
2 of 4 series · 16 of 46 positions shown, 18 images · IV contrast (APPLIED)
Comparison: None.

CLINICAL DATA: Right upper quadrant abdominal pain

EXAM:
CT ABDOMEN AND PELVIS WITH CONTRAST
TECHNIQUE: Multidetector CT imaging of the abdomen and pelvis was performed
using the standard protocol following bolus administration of
intravenous contrast.
CONTRAST:  75mL OMNIPAQUE IOHEXOL 300 MG/ML  SOLN

[Series 2: routine abd/pel with · axial · 0.53mm/px · z∈[-1177,-797]mm · 13 of 84 slices shown, 15 images]
[im 4/84  soft-tissue]
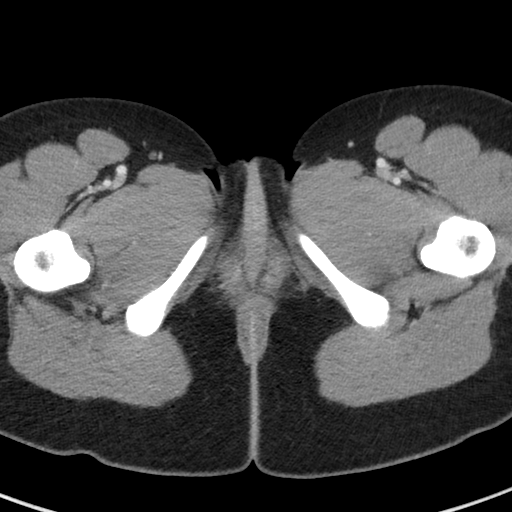
[im 4/84  bone]
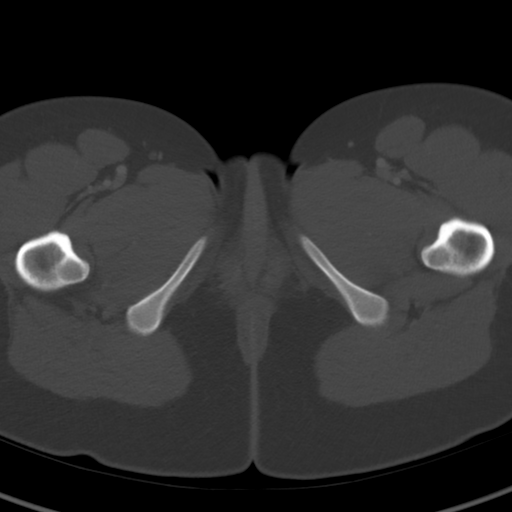
[im 11/84  soft-tissue]
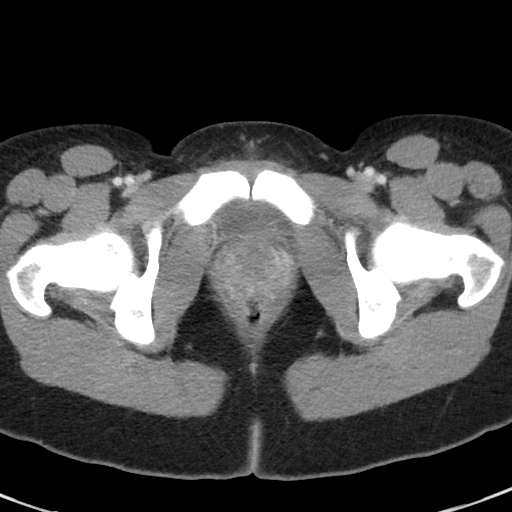
[im 18/84  soft-tissue]
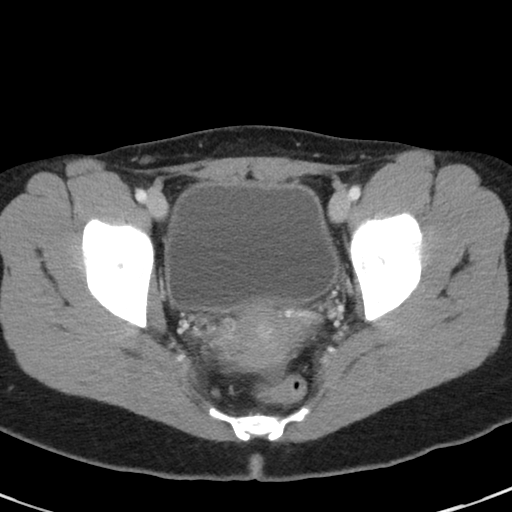
[im 25/84  soft-tissue]
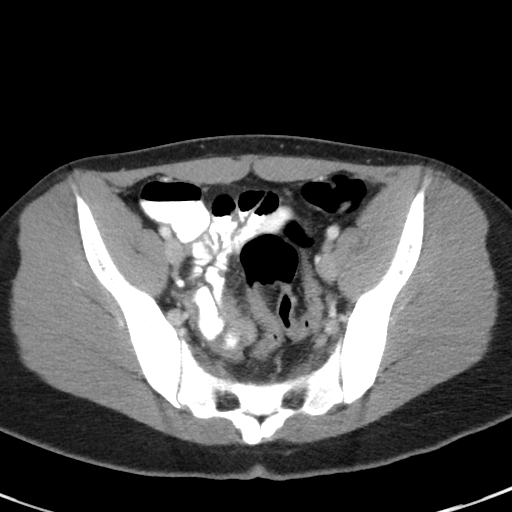
[im 28/84  soft-tissue]
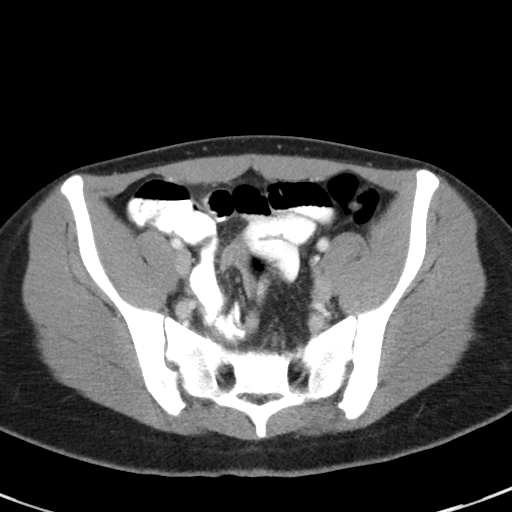
[im 35/84  soft-tissue]
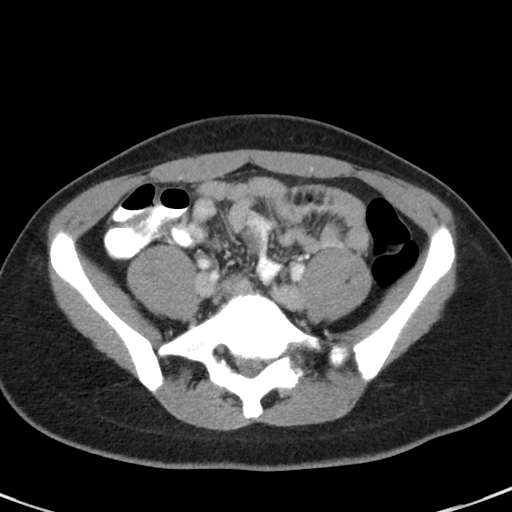
[im 42/84  soft-tissue]
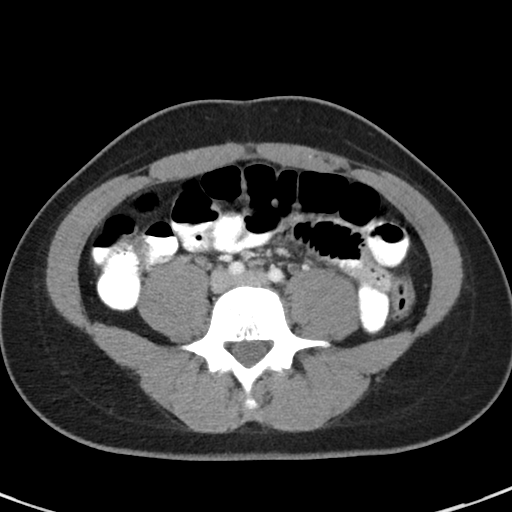
[im 49/84  soft-tissue]
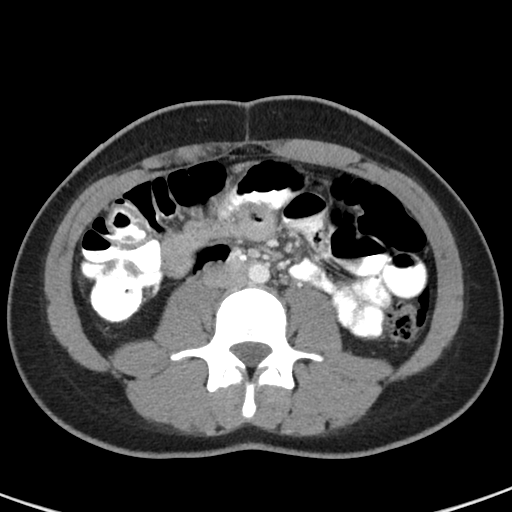
[im 56/84  soft-tissue]
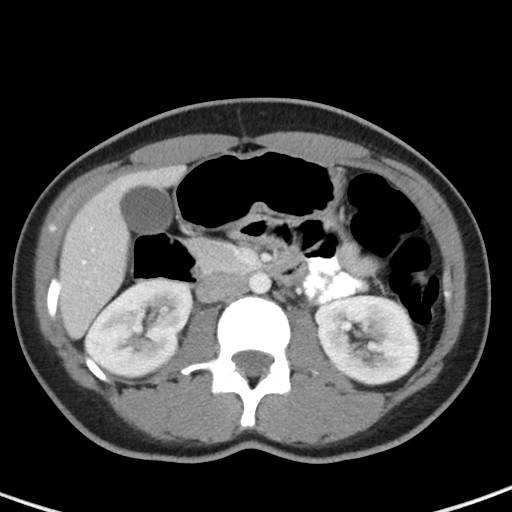
[im 56/84  bone]
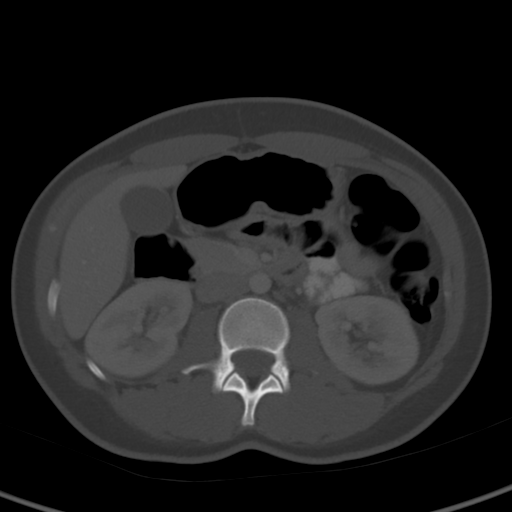
[im 59/84  soft-tissue]
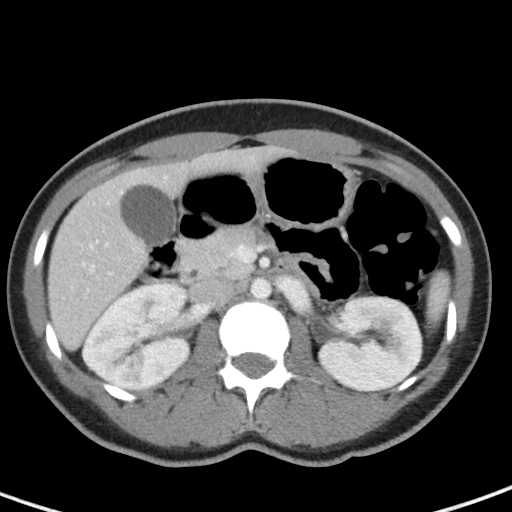
[im 66/84  soft-tissue]
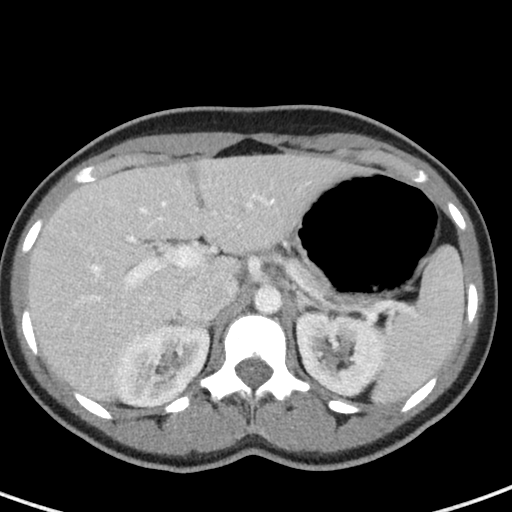
[im 73/84  soft-tissue]
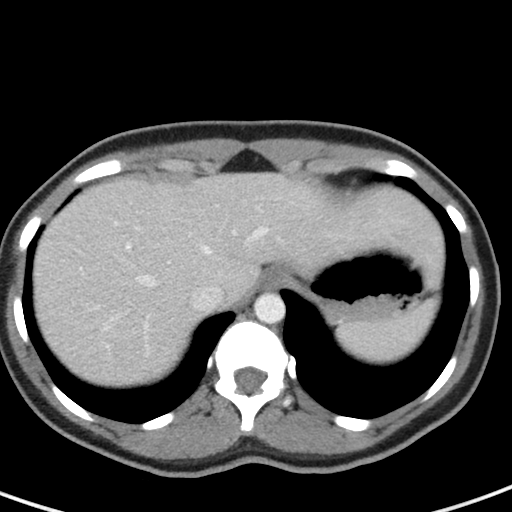
[im 80/84  soft-tissue]
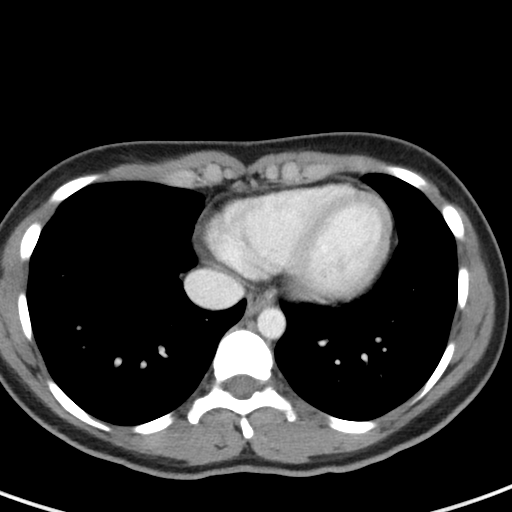

[Series 5: coronal st · coronal · 0.60mm/px · 3 of 71 slices shown]
[im 24/71  soft-tissue]
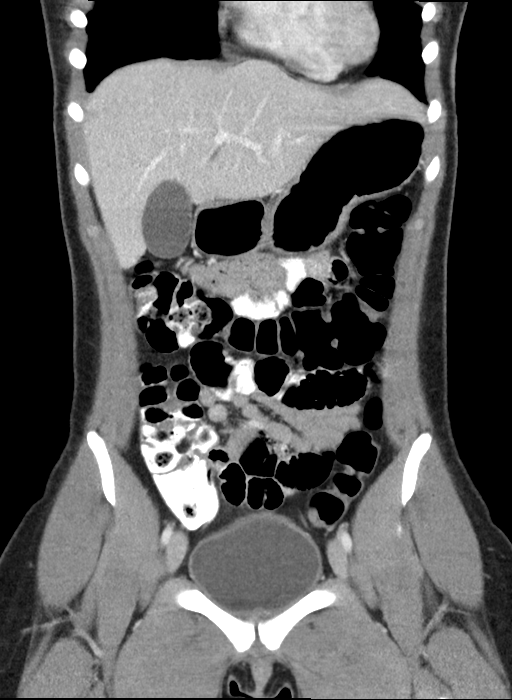
[im 32/71  soft-tissue]
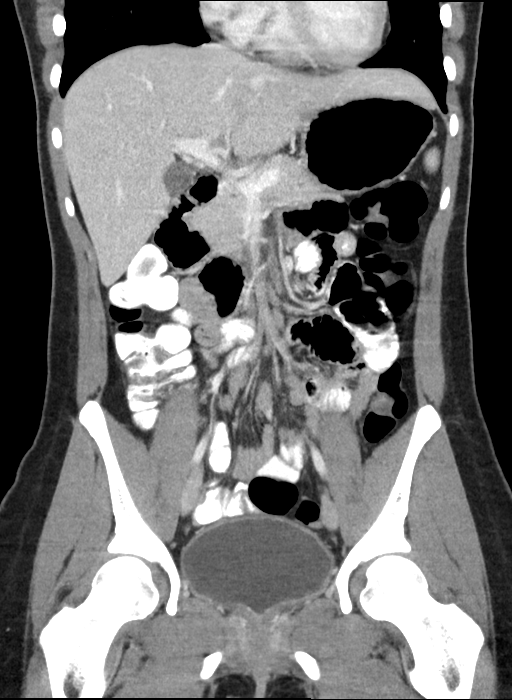
[im 39/71  soft-tissue]
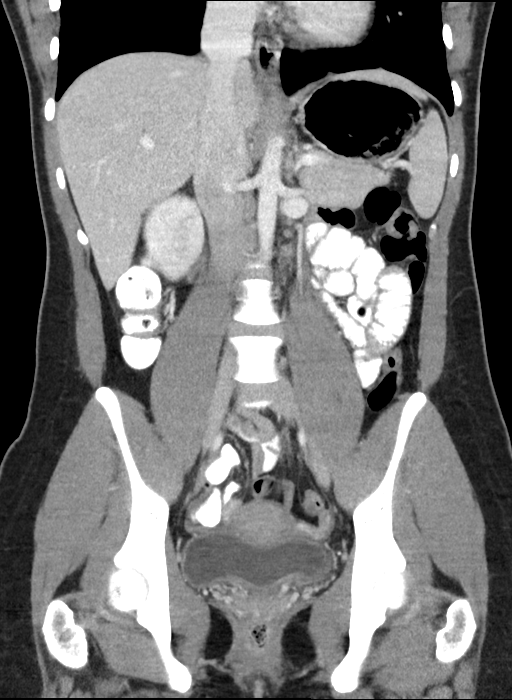

[16 of 46 positions shown; findings below may reference images not displayed]

FINDINGS: Lower chest: Lung bases are clear.

Hepatobiliary: Liver is within normal limits.

Gallbladder is unremarkable. No intrahepatic or extrahepatic ductal
dilatation.

Pancreas: Within normal limits.

Spleen: Within normal limits.

Adrenals/Urinary Tract: Adrenal glands are within normal limits.

Kidneys are within normal limits.  No hydronephrosis.

Bladder is mildly thick-walled.

Stomach/Bowel: Stomach is within normal limits.

No evidence of bowel obstruction.

Normal appendix (series 2/image 61).

No colonic wall thickening or inflammatory changes.

Vascular/Lymphatic: No evidence of abdominal aortic aneurysm.

No suspicious abdominopelvic lymphadenopathy.

Reproductive: Uterus is within normal limits.

Right ovary is within normal limits.  No left adnexal mass.

Other: Trace pelvic ascites.

Musculoskeletal: Visualized osseous structures are within normal
limits.
IMPRESSION: No evidence of bowel obstruction.  Normal appendix.

Mildly thick-walled bladder, correlate for cystitis.

Otherwise, no CT findings to account for the patient's abdominal
pain.
